# Patient Record
Sex: Female | Born: 2000
Health system: Southern US, Community
[De-identification: ages and names within clinical notes are randomized; demographics above are authoritative.]

## PROBLEM LIST (undated history)

## (undated) DIAGNOSIS — D649 Anemia, unspecified: Secondary | ICD-10-CM

## (undated) DIAGNOSIS — G47419 Narcolepsy without cataplexy: Secondary | ICD-10-CM

## (undated) HISTORY — PX: WISDOM TOOTH EXTRACTION: SHX21

## (undated) HISTORY — DX: Anemia, unspecified: D64.9

## (undated) HISTORY — DX: Narcolepsy without cataplexy: G47.419

---
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
## 2018-06-23 ENCOUNTER — Institutional Professional Consult (permissible substitution): Payer: Self-pay | Admitting: Neurology

## 2018-07-22 ENCOUNTER — Telehealth: Payer: Self-pay | Admitting: Obstetrics & Gynecology

## 2018-07-22 NOTE — Telephone Encounter (Signed)
Sandra Morrison referring for Emergent referral: Irregular menstruation - Heavy bleeding. Called and left voicemail for patient to call back to be schedule

## 2018-07-27 ENCOUNTER — Encounter: Payer: Self-pay | Admitting: Obstetrics and Gynecology

## 2018-07-27 ENCOUNTER — Ambulatory Visit (INDEPENDENT_AMBULATORY_CARE_PROVIDER_SITE_OTHER): Payer: 59 | Admitting: Obstetrics and Gynecology

## 2018-07-27 VITALS — BP 130/60 | HR 99 | Wt 256.0 lb

## 2018-07-27 DIAGNOSIS — Z6841 Body Mass Index (BMI) 40.0 and over, adult: Secondary | ICD-10-CM

## 2018-07-27 DIAGNOSIS — N939 Abnormal uterine and vaginal bleeding, unspecified: Secondary | ICD-10-CM | POA: Diagnosis not present

## 2018-07-27 MED ORDER — MEDROXYPROGESTERONE ACETATE 10 MG PO TABS: ORAL_TABLET | ORAL | 0 refills | Status: DC

## 2018-07-27 NOTE — Patient Instructions (Signed)
Polycystic Ovarian Syndrome °Polycystic ovarian syndrome (PCOS) is a common hormonal disorder among women of reproductive age. In most women with PCOS, many small fluid-filled sacs (cysts) grow on the ovaries, and the cysts are not part of a normal menstrual cycle. PCOS can cause problems with your menstrual periods and make it difficult to get pregnant. It can also cause an increased risk of miscarriage with pregnancy. If it is not treated, PCOS can lead to serious health problems, such as diabetes and heart disease. °What are the causes? °The cause of PCOS is not known, but it may be the result of a combination of certain factors, such as: °· Irregular menstrual cycle. °· High levels of certain hormones (androgens). °· Problems with the hormone that helps to control blood sugar (insulin resistance). °· Certain genes. ° °What increases the risk? °This condition is more likely to develop in women who have a family history of PCOS. °What are the signs or symptoms? °Symptoms of PCOS may include: °· Multiple ovarian cysts. °· Infrequent periods or no periods. °· Periods that are too frequent or too heavy. °· Unpredictable periods. °· Inability to get pregnant (infertility) because of not ovulating. °· Increased growth of hair on the face, chest, stomach, back, thumbs, thighs, or toes. °· Acne or oily skin. Acne may develop during adulthood, and it may not respond to treatment. °· Pelvic pain. °· Weight gain or obesity. °· Patches of thickened and dark brown or black skin on the neck, arms, breasts, or thighs (acanthosis nigricans). °· Excess hair growth on the face, chest, abdomen, or upper thighs (hirsutism). ° °How is this diagnosed? °This condition is diagnosed based on: °· Your medical history. °· A physical exam, including a pelvic exam. Your health care provider may look for areas of increased hair growth on your skin. °· Tests, such as: °? Ultrasound. This may be used to examine the ovaries and the lining of the  uterus (endometrium) for cysts. °? Blood tests. These may be used to check levels of sugar (glucose), female hormone (testosterone), and female hormones (estrogen and progesterone) in your blood. ° °How is this treated? °There is no cure for PCOS, but treatment can help to manage symptoms and prevent more health problems from developing. Treatment varies depending on: °· Your symptoms. °· Whether you want to have a baby or whether you need birth control (contraception). ° °Treatment may include nutrition and lifestyle changes along with: °· Progesterone hormone to start a menstrual period. °· Birth control pills to help you have regular menstrual periods. °· Medicines to make you ovulate, if you want to get pregnant. °· Medicine to reduce excessive hair growth. °· Surgery, in severe cases. This may involve making small holes in one or both of your ovaries. This decreases the amount of testosterone that your body produces. ° °Follow these instructions at home: °· Take over-the-counter and prescription medicines only as told by your health care provider. °· Follow a healthy meal plan. This can help you reduce the effects of PCOS. °? Eat a healthy diet that includes lean proteins, complex carbohydrates, fresh fruits and vegetables, low-fat dairy products, and healthy fats. Make sure to eat enough fiber. °· If you are overweight, lose weight as told by your health care provider. °? Losing 10% of your body weight may improve symptoms. °? Your health care provider can determine how much weight loss is best for you and can help you lose weight safely. °· Keep all follow-up visits as told by   your health care provider. This is important. °Contact a health care provider if: °· Your symptoms do not get better with medicine. °· You develop new symptoms. °This information is not intended to replace advice given to you by your health care provider. Make sure you discuss any questions you have with your health care  provider. °Document Released: 12/27/2004 Document Revised: 04/30/2016 Document Reviewed: 02/18/2016 °Elsevier Interactive Patient Education © 2018 [employer] ° °

## 2018-07-27 NOTE — Progress Notes (Signed)
Gynecology Abnormal Uterine Bleeding Initial Evaluation   Chief Complaint:  Chief Complaint  Patient presents with  . Referral    heavy bleeding    History of Present Illness:    Paitient is a 17 y.o. No obstetric history on file. who LMP was Patient's last menstrual period was 07/22/2018 (exact date)., presents today for a problem visit.  She complains of menometrorrhagia that has been present for sometime.  The patient menstrual complaints are chronic present for the past 6 months.  She has irregular cycle intervals and they are associated with mild menstrual cramping.  Will often times skip months with prolonged bleeding several week for the last two cycles. She currently uses OCP (estrogen/progesterone)for contraception, but has continued to have some bleeding.  She did stop bleeding following high dose provera 20mg  tid, but started bleeding again after moving to 20mg  daily.    Previous evaluation: None Prior Diagnosis: polycystic ovarian disease. Previous Treatment: POP (Provera) and OCP.  LMP: Patient's last menstrual period was 07/22/2018 (exact date). Menarche:13  States periods were initially monthly and regular and then started becoming more irregular.     Paramter Normal / Abnormal Prsent  Frequency Amenoorhea     Infrequent (>38 days) X   Normal (?24 days ?38 days)     Freequent (<24 days)    Duration Normal (?8 days)     Prolonged (>8 days) X  Regularity Regular (shortest to longest cycle variation ?7-9 days)*     Irregular (shortest to longest cycle variation ?8-10days)* X  Flow Volume Light    (Self reported) Normal     Heavy X      Intermenstrual Bleeding None X   Random     Cyclical early     Cyclical mid     Cyclical late        Unscheduled Bleeding  Not applicable    (exogenous hormones) Absent     Present X   FIGO AUB I System: *The available evidence suggests that, using these criteria, the normal range (shortest to longest) varies with age:  7-25 y of age, ?19 d; 45-41 y, ?7 d; and for 69-45 y, ?9 d    Review of Systems: ROS  Past Medical History:  Past Medical History:  Diagnosis Date  . Anemia   . Narcolepsy     Past Surgical History:  Past Surgical History:  Procedure Laterality Date  . WISDOM TOOTH EXTRACTION      Obstetric History: No obstetric history on file.  Family History:  Family History  Problem Relation Age of Onset  . Melanoma Mother   . Ovarian cancer Paternal Grandmother 60  . Esophageal cancer Paternal Grandfather     Social History:  Social History   Socioeconomic History  . Marital status: Single    Spouse name: Not on file  . Number of children: Not on file  . Years of education: Not on file  . Highest education level: Not on file  Occupational History  . Not on file  Social Needs  . Financial resource strain: Not on file  . Food insecurity:    Worry: Not on file    Inability: Not on file  . Transportation needs:    Medical: Not on file    Non-medical: Not on file  Tobacco Use  . Smoking status: Never Smoker  . Smokeless tobacco: Never Used  Substance and Sexual Activity  . Alcohol use: Never    Frequency: Never  . Drug  use: Never  . Sexual activity: Yes    Birth control/protection: None  Lifestyle  . Physical activity:    Days per week: Not on file    Minutes per session: Not on file  . Stress: Not on file  Relationships  . Social connections:    Talks on phone: Not on file    Gets together: Not on file    Attends religious service: Not on file    Active member of club or organization: Not on file    Attends meetings of clubs or organizations: Not on file    Relationship status: Not on file  . Intimate partner violence:    Fear of current or ex partner: Not on file    Emotionally abused: Not on file    Physically abused: Not on file    Forced sexual activity: Not on file  Other Topics Concern  . Not on file  Social History Narrative  . Not on file     Allergies:  No Known Allergies  Medications: Prior to Admission medications   Medication Sig Start Date End Date Taking? Authorizing Provider  methylphenidate (RITALIN) 10 MG tablet Take 10 mg by mouth 2 (two) times daily.   Yes [provider]    Physical Exam Blood pressure 130/60, pulse 99, weight 256 lb (116.1 kg), last menstrual period 07/22/2018. There is no height or weight on file to calculate BMI.  Patient's last menstrual period was 07/22/2018 (exact date).  General: NAD HEENT: normocephalic, anicteric Thyroid: no enlargement, no palpable nodules Pulmonary: No increased work of breathing Cardiovascular: RRR, distal pulses 2+ Extremities: no edema, erythema, or tenderness Neurologic: Grossly intact Psychiatric: mood appropriate, affect full    Assessment: 17 y.o. No obstetric history on file. with abnormal uterine bleeding  Plan: Problem List Items Addressed This Visit    None    Visit Diagnoses    Abnormal uterine bleeding    -  Primary   Relevant Orders   TSH+Prl+FSH+TestT+LH+DHEA S...   Hemoglobin A1c   US Transvaginal Non-OB   Class 3 severe obesity without serious comorbidity with body mass index (BMI) of 40.0 to 44.9 in adult, unspecified obesity type (HCC)       Relevant Medications   methylphenidate (RITALIN) 10 MG tablet   Other Relevant Orders   Hemoglobin A1c      1) Discussed management options for abnormal uterine bleeding including expectant, NSAIDs, tranexamic acid (Lysteda), oral progesterone (Provera, norethindrone, megace), Depo Provera, Levonorgestrel containing IUD, endometrial ablation (Novasure) or hysterectomy as definitive surgical management.  Discussed risks and benefits of each method.   Final management decision will hinge on results of patient's work up and whether an underlying etiology for the patients bleeding symptoms can be discerned.  We will conduct a basic work up examining using the PALM-COIEN classification  system.  In the meantime the patient opts to trial Provera while we await results of her ultrasound and labs.  The role of unopposed estrogen in the development of endometrial hyperplasia or carcinoma is discussed.  The risk of endometrial hyperplasia is linearly correlated with increasing BMI given the production of estrone by adipose tissue. Printed patient education handouts were given to the patient to review at home.  Bleeding precautions reviewed.  - high dose provera with cessation to elicit withdrawal bleed  - allow withdrawal bleed to finish prior to starting OCP  2) Return in about 1 week (around 08/03/2018) for TVUS and follow up.   [name], MD, Cherlynn June  [place], [facility] 07/27/2018, 9:39 AM

## 2018-07-31 LAB — TSH+PRL+FSH+TESTT+LH+DHEA S...
17-Hydroxyprogesterone: 11 ng/dL
ANDROSTENEDIONE: 54 ng/dL (ref 41–262)
DHEA-SO4: 247.3 ug/dL (ref 110.0–433.2)
FSH: 0.3 m[IU]/mL
LH: <0.2 m[IU]/mL
Prolactin: 20.5 ng/mL (ref 4.8–23.3)
TESTOSTERONE: 40 ng/dL
TSH: 2.48 u[IU]/mL (ref 0.450–4.500)
Testosterone, Free: 6.6 pg/mL

## 2018-07-31 LAB — HEMOGLOBIN A1C
Est. average glucose Bld gHb Est-mCnc: 103 mg/dL
Hgb A1c MFr Bld: 5.2 % (ref 4.8–5.6)

## 2018-08-04 ENCOUNTER — Encounter: Payer: Self-pay | Admitting: Obstetrics and Gynecology

## 2018-08-04 ENCOUNTER — Ambulatory Visit (INDEPENDENT_AMBULATORY_CARE_PROVIDER_SITE_OTHER): Payer: 59

## 2018-08-04 ENCOUNTER — Ambulatory Visit (INDEPENDENT_AMBULATORY_CARE_PROVIDER_SITE_OTHER): Payer: 59 | Admitting: Obstetrics and Gynecology

## 2018-08-04 VITALS — BP 130/60 | HR 122 | Ht 66.0 in | Wt 268.0 lb

## 2018-08-04 DIAGNOSIS — E282 Polycystic ovarian syndrome: Secondary | ICD-10-CM | POA: Diagnosis not present

## 2018-08-04 DIAGNOSIS — N939 Abnormal uterine and vaginal bleeding, unspecified: Secondary | ICD-10-CM

## 2018-08-04 MED ORDER — NORGESTIMATE-ETH ESTRADIOL 0.25-35 MG-MCG PO TABS: 1.0000 | ORAL_TABLET | Freq: Every day | ORAL | 11 refills | Status: DC

## 2018-08-04 NOTE — Progress Notes (Signed)
Gynecology Ultrasound Follow Up  Chief Complaint:  Chief Complaint  Patient presents with  . Follow-up    GYN U/S     History of Present Illness: Patient is a 17 y.o. female who presents today for ultrasound evaluation of abnormal uterine bleeding.  Ultrasound demonstrates the following findgins Adnexa: Polycystic appearance Uterus: Non-enlarged with endometrial stripe non-thickend at just over 61mm, no focal abnormalities Additional: no free fluid  Patient reports she never stopped bleeding on Provera.  Endometrial stripe appears thinned out on imaging today.  Review of Systems: Review of Systems  Constitutional: Negative.   Gastrointestinal: Negative.   Endo/Heme/Allergies: Negative.     Past Medical History:  Past Medical History:  Diagnosis Date  . Anemia   . Narcolepsy     Past Surgical History:  Past Surgical History:  Procedure Laterality Date  . WISDOM TOOTH EXTRACTION      Gynecologic History:  Patient's last menstrual period was 07/22/2018 (exact date).  Family History:  Family History  Problem Relation Age of Onset  . Melanoma Mother   . Ovarian cancer Paternal Grandmother 45  . Esophageal cancer Paternal Grandfather     Social History:  Social History   Socioeconomic History  . Marital status: Single    Spouse name: Not on file  . Number of children: Not on file  . Years of education: Not on file  . Highest education level: Not on file  Occupational History  . Not on file  Social Needs  . Financial resource strain: Not on file  . Food insecurity:    Worry: Not on file    Inability: Not on file  . Transportation needs:    Medical: Not on file    Non-medical: Not on file  Tobacco Use  . Smoking status: Never Smoker  . Smokeless tobacco: Never Used  Substance and Sexual Activity  . Alcohol use: Never    Frequency: Never  . Drug use: Never  . Sexual activity: Yes    Birth control/protection: None  Lifestyle  . Physical  activity:    Days per week: Not on file    Minutes per session: Not on file  . Stress: Not on file  Relationships  . Social connections:    Talks on phone: Not on file    Gets together: Not on file    Attends religious service: Not on file    Active member of club or organization: Not on file    Attends meetings of clubs or organizations: Not on file    Relationship status: Not on file  . Intimate partner violence:    Fear of current or ex partner: Not on file    Emotionally abused: Not on file    Physically abused: Not on file    Forced sexual activity: Not on file  Other Topics Concern  . Not on file  Social History Narrative  . Not on file    Allergies:  No Known Allergies  Medications: Prior to Admission medications   Medication Sig Start Date End Date Taking? Authorizing Provider  medroxyPROGESTERone (PROVERA) 10 MG tablet Take 2 tablets (20mg ) po tid x 7 days 07/27/18  Yes [name], MD  methylphenidate (RITALIN) 10 MG tablet Take 10 mg by mouth 2 (two) times daily.   Yes [provider]  norgestimate-ethinyl estradiol (ORTHO-CYCLEN,SPRINTEC,PREVIFEM) 0.25-35 MG-MCG tablet Take 1 tablet by mouth daily. 08/04/18   [name], MD    Physical Exam Vitals: Blood pressure 130/60, pulse Marland Kitchen)  122, height 5\' 6"  (1.676 m), weight 268 lb (121.6 kg), last menstrual period 07/22/2018.  General: NAD HEENT: normocephalic, anicteric Pulmonary: No increased work of breathing Extremities: no edema, erythema, or tenderness Neurologic: Grossly intact, normal gait Psychiatric: mood appropriate, affect full  US Transvaginal Non-ob  Result Date: 08/06/2018 Patient Name: Sandra Morrison DOB: Oct 16, 2000 MRN: [id number] ULTRASOUND REPORT Location: [facility] Date of Service: 08/04/2018 Indications:AUB Findings: The uterus is anteverted and measures 8.7 x 4.4 x 4.7cm. Echo texture is homogenous without evidence of focal masses. The Endometrium measures 5.1 mm. Right  Ovary measures 2.7 x 2.4 x 1.8 cm. Left Ovary measures 2.6 x 2.3 x 1.3 cm. Follicles are difficult to see on right ovary. Left ovary has multiple small peripheral follicles suggestive of PCOS. Survey of the adnexa demonstrates no adnexal masses. There is no free fluid in the cul de sac. Impression: 1. Questionable PCOS, otherwise normal gyn ultrasound. Recommendations: 1.Clinical correlation with the patient's History and Physical Exam. Vita Barley, RDMS RVT Images reviewed.  Normal GYN study other than polycystic appearing ovaries.  [name], MD, [facility], [facility]     Assessment: 17 y.o. G0P0000 No problem-specific Assessment & Plan notes found for this encounter.   Plan: Problem List Items Addressed This Visit      Endocrine   PCOS (polycystic ovarian syndrome) - Primary    Other Visit Diagnoses    Abnormal uterine bleeding          1) PCOS based on labs and ultrasound findings today  2) Start sprintec in 1 week. Follow up 2 months to assess response  3) Discussed addition of metformin if still having some bleeding issues  4) Return in about 2 months (around 10/04/2018) for medication follow up.    [name], MD, [facility], [facility] 08/04/2018, 11:57 AM

## 2018-08-05 ENCOUNTER — Institutional Professional Consult (permissible substitution): Payer: Self-pay | Admitting: Neurology

## 2018-09-28 ENCOUNTER — Ambulatory Visit: Payer: 59 | Admitting: Neurology

## 2018-09-28 ENCOUNTER — Encounter: Payer: Self-pay | Admitting: Neurology

## 2018-09-28 VITALS — BP 149/82 | HR 83 | Ht 66.0 in | Wt 274.0 lb

## 2018-09-28 DIAGNOSIS — E282 Polycystic ovarian syndrome: Secondary | ICD-10-CM | POA: Diagnosis not present

## 2018-09-28 DIAGNOSIS — N946 Dysmenorrhea, unspecified: Secondary | ICD-10-CM

## 2018-09-28 DIAGNOSIS — R6889 Other general symptoms and signs: Secondary | ICD-10-CM | POA: Diagnosis not present

## 2018-09-28 DIAGNOSIS — G4719 Other hypersomnia: Secondary | ICD-10-CM

## 2018-09-28 DIAGNOSIS — Z793 Long term (current) use of hormonal contraceptives: Secondary | ICD-10-CM
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
## 2018-10-05 ENCOUNTER — Encounter: Payer: Self-pay | Admitting: Neurology

## 2018-10-05 ENCOUNTER — Telehealth: Payer: Self-pay | Admitting: Neurology

## 2018-10-05 LAB — NARCOLEPSY EVALUATION
HLA-DQ ALPHA: NEGATIVE
HLA-DQ BETA: NEGATIVE

## 2018-10-05 NOTE — Telephone Encounter (Signed)
Pt would like a call back with her lab results. Please advise.

## 2018-10-05 NOTE — Telephone Encounter (Signed)
Lab results are still pending at this time. I will call the patient and advise of the lab results once they have posted.

## 2018-10-06 ENCOUNTER — Encounter: Payer: Self-pay | Admitting: Obstetrics and Gynecology

## 2018-10-06 ENCOUNTER — Telehealth: Payer: Self-pay | Admitting: Neurology

## 2018-10-06 ENCOUNTER — Ambulatory Visit (INDEPENDENT_AMBULATORY_CARE_PROVIDER_SITE_OTHER): Payer: 59 | Admitting: Obstetrics and Gynecology

## 2018-10-06 VITALS — BP 116/62 | HR 107 | Ht 66.0 in | Wt 275.0 lb

## 2018-10-06 DIAGNOSIS — E282 Polycystic ovarian syndrome: Secondary | ICD-10-CM | POA: Diagnosis not present

## 2018-10-06 DIAGNOSIS — Z3041 Encounter for surveillance of contraceptive pills: Secondary | ICD-10-CM | POA: Diagnosis not present

## 2018-10-06 DIAGNOSIS — N939 Abnormal uterine and vaginal bleeding, unspecified: Secondary | ICD-10-CM | POA: Diagnosis not present

## 2018-10-06 NOTE — Progress Notes (Signed)
Obstetrics & Gynecology Office Visit   Chief Complaint:  Chief Complaint  Patient presents with  . Follow-up    Medication    History of Present Illness: 18 y.o. G0P0000 presenting for medication follow up for a diagnosis of abnormal uterine bleeding (AUB-O secondary to PCOS).  She is currently being managed with combination OCP.   The patient reports good control of symptoms on her current regimen.  On her current medication regimen she initially had some continued breakthrough bleeding on pill pack 1 which has subsided on starting the second pill pack.   She has not noted any side-effects or new symptoms.    Review of Systems: Review of Systems  Constitutional: Negative.   Gastrointestinal: Negative.   Genitourinary: Negative.      Past Medical History:  Past Medical History:  Diagnosis Date  . Anemia   . Narcolepsy     Past Surgical History:  Past Surgical History:  Procedure Laterality Date  . WISDOM TOOTH EXTRACTION      Gynecologic History: Patient's last menstrual period was 09/30/2018 (exact date).  Obstetric History: G0P0000  Family History:  Family History  Problem Relation Age of Onset  . Melanoma Mother   . Ovarian cancer Paternal Grandmother 61  . Esophageal cancer Paternal Grandfather     Social History:  Social History   Socioeconomic History  . Marital status: Single    Spouse name: Not on file  . Number of children: Not on file  . Years of education: Not on file  . Highest education level: Not on file  Occupational History  . Not on file  Social Needs  . Financial resource strain: Not on file  . Food insecurity:    Worry: Not on file    Inability: Not on file  . Transportation needs:    Medical: Not on file    Non-medical: Not on file  Tobacco Use  . Smoking status: Never Smoker  . Smokeless tobacco: Never Used  Substance and Sexual Activity  . Alcohol use: Never    Frequency: Never  . Drug use: Never  . Sexual activity:  Yes    Birth control/protection: None  Lifestyle  . Physical activity:    Days per week: Not on file    Minutes per session: Not on file  . Stress: Not on file  Relationships  . Social connections:    Talks on phone: Not on file    Gets together: Not on file    Attends religious service: Not on file    Active member of club or organization: Not on file    Attends meetings of clubs or organizations: Not on file    Relationship status: Not on file  . Intimate partner violence:    Fear of current or ex partner: Not on file    Emotionally abused: Not on file    Physically abused: Not on file    Forced sexual activity: Not on file  Other Topics Concern  . Not on file  Social History Narrative  . Not on file    Allergies:  No Known Allergies  Medications: Prior to Admission medications   Medication Sig Start Date End Date Taking? Authorizing Provider  norgestimate-ethinyl estradiol (ORTHO-CYCLEN,SPRINTEC,PREVIFEM) 0.25-35 MG-MCG tablet Take 1 tablet by mouth daily. 08/04/18  Yes [name], MD  methylphenidate (RITALIN) 10 MG tablet Take 10 mg by mouth daily as needed (last taken 09/15/2018).     [provider]    Physical  Exam Vitals:  Vitals:   10/06/18 1009  BP: 116/62  Pulse: (!) 107   Patient's last menstrual period was 09/30/2018 (exact date).  General: NAD HEENT: normocephalic, anicteric Pulmonary: No increased work of breathing Neurologic: Grossly intact Psychiatric: mood appropriate, affect full  Female chaperone present for pelvic  portions of the physical exam  Assessment: 18 y.o. G0P0000 presenting for follow up of AUB-O  Plan: Problem List Items Addressed This Visit      Endocrine   PCOS (polycystic ovarian syndrome)    Other Visit Diagnoses    Encounter for surveillance of contraceptive pills    -  Primary   Abnormal uterine bleeding         1) AUB - good response.  Continue OCP for cycle control.  Discussed should she have  issues with renewed AUB or dysmenorrhea we discussed possible additional next steps.  2) A total of 15 minutes were spent in face-to-face contact with the patient during this encounter with over half of that time devoted to counseling and coordination of care.  3) Return in about 1 year (around 10/07/2019) for annual.    [name], MD, [place], [facility] 10/06/2018, 10:37 AM

## 2018-10-06 NOTE — Telephone Encounter (Signed)
-----  Message from [name], MD sent at 10/05/2018  5:22 PM EST ----- Negative HLA test.  Is she willing to undergo MSLT ?  This test has a specificity of  85% for narcolepsy, consider.  Please send a CC to  Charlott Holler, NP

## 2018-10-06 NOTE — Telephone Encounter (Signed)
Called the patient and made her aware that the gene for narcolepsy testing came back negative. Advised this patient this means she doesn't carry the gene for narcolepsy. Informed the patient to continue to withhold her ritalin medication as she is scheduled for the MSLT and the patient would like to move forward with that. Pt verbalized understanding. Pt had no questions at this time but was encouraged to call back if questions arise.

## 2018-10-26 ENCOUNTER — Ambulatory Visit (INDEPENDENT_AMBULATORY_CARE_PROVIDER_SITE_OTHER): Payer: 59 | Admitting: Neurology

## 2018-10-26 DIAGNOSIS — N946 Dysmenorrhea, unspecified: Secondary | ICD-10-CM

## 2018-10-26 DIAGNOSIS — Z793 Long term (current) use of hormonal contraceptives: Secondary | ICD-10-CM

## 2018-10-26 DIAGNOSIS — G4710 Hypersomnia, unspecified: Secondary | ICD-10-CM

## 2018-10-26 DIAGNOSIS — R6889 Other general symptoms and signs: Secondary | ICD-10-CM

## 2018-10-26 DIAGNOSIS — E282 Polycystic ovarian syndrome: Secondary | ICD-10-CM

## 2018-10-26 DIAGNOSIS — G4719 Other hypersomnia: Secondary | ICD-10-CM

## 2018-10-27 ENCOUNTER — Other Ambulatory Visit: Payer: Self-pay | Admitting: Neurology

## 2018-10-27 ENCOUNTER — Ambulatory Visit (INDEPENDENT_AMBULATORY_CARE_PROVIDER_SITE_OTHER): Payer: 59 | Admitting: Neurology

## 2018-10-27 DIAGNOSIS — Z793 Long term (current) use of hormonal contraceptives: Secondary | ICD-10-CM

## 2018-10-27 DIAGNOSIS — E282 Polycystic ovarian syndrome: Secondary | ICD-10-CM

## 2018-10-27 DIAGNOSIS — G4719 Other hypersomnia: Secondary | ICD-10-CM

## 2018-10-27 DIAGNOSIS — R6889 Other general symptoms and signs: Secondary | ICD-10-CM | POA: Diagnosis not present

## 2018-10-27 DIAGNOSIS — G4752 REM sleep behavior disorder: Secondary | ICD-10-CM

## 2018-10-27 DIAGNOSIS — N946 Dysmenorrhea, unspecified: Secondary | ICD-10-CM

## 2018-10-27 NOTE — Addendum Note (Signed)
Addended by: Inis Sizer D on: 10/27/2018 04:12 PM   Modules accepted: Orders

## 2018-10-30 LAB — COMPREHENSIVE DRUG ANALYSIS,UR

## 2018-11-01 ENCOUNTER — Encounter: Payer: Self-pay | Admitting: Neurology

## 2018-11-01 DIAGNOSIS — R6889 Other general symptoms and signs: Secondary | ICD-10-CM | POA: Insufficient documentation

## 2018-11-01 DIAGNOSIS — G4719 Other hypersomnia: Secondary | ICD-10-CM | POA: Insufficient documentation
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
## 2018-11-01 NOTE — Procedures (Signed)
[facility]  Name:  Sandra Morrison, Sandra Morrison. Reference [id number]  Study Date: 10/27/2018 Procedure #: [id number]  DOB: [birth year]    Protocol  This is a 13 channel Multiple Sleep Latency Test comprised of 5 channels of EEG (T3-Cz, Cz-T4, F4-M1, C4-M1, O2-M1), 3 channels of Chin EMG, 4 channels of EOG and 1 channel for ECG.   All channels were sampled at 256hz .    This polysomnographic procedure is designed to evaluate (1) the complaint of excessive daytime sleepiness by quantifying the time required to fall asleep and (2) the possibility of narcolepsy by checking for abnormally short latencies to REM sleep.  Electrographic variables include EEG, EMG, EOG and ECG.  Patients are monitored throughout four or five 20-minute opportunities to sleep (naps) at two-hour intervals.  For each nap, the patient is allowed 20 minutes to fall asleep.  Once asleep, the patient is awakened after 15 minutes.  Between naps, the patient is kept as alert as possible.  A sleep latency of 20 minutes indicates that no sleep occurred.  Parametric Analysis  Total Number of Naps 5     NAP # Time of Nap  Sleep Latency (mins) REM Latency (mins) Sleep Time Percent Awake Time Percent  1 07:19 5 0 77 23   2 09:15 6 0 71  29   3 11:15 6 0 73  27   4 13:12 5 0 80  20   5 15:14 2 0 91  9    MSLT Summary of Naps  Sleepiness Index: 76  Mean Sleep Latency to all Five Naps: 4.8  Mean Sleep Latency to First Four Naps: 5.5  Mean Sleep Latency to First Three Naps: 5.7  Mean Sleep Latency to First Two Naps: 5.5  Number of Naps with REM Sleep: 0    Results from Preceding PSG Study  Sleep Onset Time 10:15 Sleep Efficiency (%) 96.5%  Rise Time 05:37 Sleep Latency (min) 6 min  Total Sleep Time  432.5 min REM Latency (min) 61.5 min    I attest to having reviewed every epoch of the entire raw data recording prior to the issuance of this report in accordance with the Standards of the American  Academy of Sleep Medicine.       IMPRESSION:  1. This multiple sleep latency test reveals a mean sleep latency of 4.8 minutes with 0 sleep periods (out of 5 sleep periods) during which REM sleep was recorded.  A total of 5 sleep periods were recorded.   2. This study was preceded by an overnight polysomnogram with a total sleep time (TST) of 432.5 minutes.       Name:  [name], Staff. Reference #:  [id number]  Study Date: 10/27/2018 DOB: August 17, 2001    RECOMMENDATIONS: This MSLT study is consistent with severe hypersomnolence. The study may indicate idiopathic hypersomnolence with long sleep duration. This study is not consistent with a diagnosis of narcolepsy due to the lack of REM sleep onset in any of the recorded naps and lack of early onset REM sleep in the preceding PSG.    [name], M.D.  10-31-2018 Diplomat, American Board of Psychiatry and Neurology  Diplomat, [place] of Sleep Medicine Market researcher, [place] [facility]

## 2018-11-05 ENCOUNTER — Ambulatory Visit: Payer: 59 | Admitting: Neurology

## 2018-11-05 ENCOUNTER — Encounter: Payer: Self-pay | Admitting: Neurology

## 2018-11-05 VITALS — BP 110/72 | HR 77 | Ht 67.0 in | Wt 278.0 lb

## 2018-11-05 DIAGNOSIS — G4711 Idiopathic hypersomnia with long sleep time: Secondary | ICD-10-CM | POA: Diagnosis not present

## 2018-11-05 DIAGNOSIS — G4726 Circadian rhythm sleep disorder, shift work type: Secondary | ICD-10-CM

## 2018-11-05 MED ORDER — ARMODAFINIL 200 MG PO TABS: 200.0000 mg | ORAL_TABLET | Freq: Every day | ORAL | 5 refills | Status: DC

## 2018-11-05 NOTE — Patient Instructions (Signed)
Armodafinil tablets What is this medicine? ARMODAFINIL (ar moe DAF i nil) is used to treat excessive sleepiness caused by certain sleep disorders. This includes narcolepsy, sleep apnea, and shift work sleep disorder. This medicine may be used for other purposes; ask your health care provider or pharmacist if you have questions. COMMON BRAND NAME(S): Nuvigil What should I tell my health care provider before I take this medicine? They need to know if you have any of these conditions: -bipolar disorder -depression -drug or alcohol abuse or addiction -heart disease -high blood pressure -kidney disease -liver disease -schizophrenia -suicidal thoughts, plans, or attempt; a previous suicide attempt by you or a family member -an unusual or allergic reaction to armodafinil, modafinil, medicines, foods, dyes, or preservatives -pregnant or trying to get pregnant -breast-feeding How should I use this medicine? Take this medicine by mouth with a glass of water. Follow the directions on the prescription label. Take your doses at regular intervals. Do not take your medicine more often than directed. Do not stop taking this medicine suddenly except upon the advice of your doctor. Stopping this medicine too quickly may cause serious side effects or your condition may worsen. A special MedGuide will be given to you by the pharmacist with each prescription and refill. Be sure to read this information carefully each time. Talk to your pediatrician regarding the use of this medicine in children. While this drug may be prescribed for children as young as 17 years of age for selected conditions, precautions do apply. Overdosage: If you think you have taken too much of this medicine contact a poison control center or emergency room at once. NOTE: This medicine is only for you. Do not share this medicine with others. What if I miss a dose? If you miss a dose, take it as soon as you can. If it is almost time for  your next dose, take only that dose. Do not take double or extra doses. What may interact with this medicine? Do not take this medicine with any of the following medications: -amphetamine or dextroamphetamine -dexmethylphenidate or methylphenidate -MAOIs like Carbex, Eldepryl, Marplan, Nardil, and Parnate -pemoline -procarbazine This medicine may also interact with the following medications: -antifungal medicines like itraconazole or ketoconazole -barbiturates, like phenobarbital -birth control pills or other hormone-containing birth control devices or implants -carbamazepine -cyclosporine -diazepam -medicines for depression, anxiety, or psychotic disturbances -phenytoin -propranolol -triazolam -warfarin This list may not describe all possible interactions. Give your health care provider a list of all the medicines, herbs, non-prescription drugs, or dietary supplements you use. Also tell them if you smoke, drink alcohol, or use illegal drugs. Some items may interact with your medicine. What should I watch for while using this medicine? Visit your doctor or health care professional for regular checks on your progress. The full effect of this medicine may not be seen right away. This medicine may affect your concentration, function, or may hide signs that you are tired. You may get dizzy. This medicine will not eliminate your abnormal tendency to fall asleep and is not a replacement for sleep. Do not change your previous behavior regarding potentially dangerous activities, such as driving, using machinery, or doing anything that needs mental alertness until you know how this drug affects you. Alcohol can make you more dizzy and may interfere with your response to this medicine or your alertness. Avoid alcoholic drinks. Birth control pills may not work properly while you are taking this medicine. While using birth control pills, you will need an  additional barrier method or an alternative  non-hormonal method of birth control during treatment with armodafinil and for 1 month after stopping armodafinil. Talk to your doctor about which extra method of birth control is right for you. It is unknown if the effects of this medicine will be increased by the use of caffeine. Caffeine is found in many foods, beverages, and medications. Ask your doctor if you should limit or change your intake of caffeine-containing products while on this medicine. Do not stop previously prescribed treatments for your condition, such as a CPAP machine, except on the advise of your physician or health care professional. What side effects may I notice from receiving this medicine? Side effects that you should report to your doctor or health care professional as soon as possible: -allergic reactions like skin rash, itching or hives, swelling of the face, lips, or tongue -anxiety -breathing or swallowing problems -chest pain -depressed mood -elevated mood, decreased need for sleep, racing thoughts, impulsive behavior -fast, irregular heartbeat -fever with rash, swollen lymph nodes, or swelling of the face -hallucination, loss of contact with reality -increased blood pressure -mouth sores, blisters, or peeling skin -sore throat, fever, or chills -suicidal thoughts or other mood changes -tremors -vomiting Side effects that usually do not require medical attention (report to your doctor or health care professional if they continue or are bothersome): -headache -nausea, diarrhea, or stomach upset -nervousness -trouble sleeping This list may not describe all possible side effects. Call your doctor for medical advice about side effects. You may report side effects to FDA at 1-800-FDA-1088. Where should I keep my medicine? Keep out of the reach of children. Store at room temperature between 20 and 25 degrees C (68 and 77 degrees F). Throw away any unused medicine after the expiration date. NOTE: This sheet is  a summary. It may not cover all possible information. If you have questions about this medicine, talk to your doctor, pharmacist, or health care provider.  2019 Elsevier/Gold Standard (2016-02-09 18:16:30)
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
## 2018-11-17 ENCOUNTER — Encounter: Payer: Self-pay | Admitting: Neurology

## 2018-11-18 NOTE — Telephone Encounter (Signed)
The alternative medications are SUNOSI, which is not metabolized through the liver and should therefor be less likely to interfere with birth control.  Cave" FDA approved only for proven Narcolepsy, and for OSA and shift working disorder.   [name], MD

## 2018-11-19 ENCOUNTER — Other Ambulatory Visit: Payer: Self-pay | Admitting: Neurology

## 2018-11-19 ENCOUNTER — Telehealth: Payer: Self-pay | Admitting: Neurology

## 2018-11-19 MED ORDER — AMPHETAMINE-DEXTROAMPHETAMINE 10 MG PO TABS: ORAL_TABLET | ORAL | 0 refills | Status: DC

## 2018-11-19 NOTE — Addendum Note (Signed)
Addended by: Darleen Crocker on: 11/19/2018 10:36 AM   Modules accepted: Orders

## 2018-11-19 NOTE — Telephone Encounter (Signed)
Dr Dohmeier agreed to order the patient Adderall immediate release and see how the patient tolerates that before we can order the adderall 10 mg XR medication.

## 2018-12-21 ENCOUNTER — Encounter: Payer: Self-pay | Admitting: Neurology

## 2018-12-21 ENCOUNTER — Other Ambulatory Visit: Payer: Self-pay | Admitting: Neurology

## 2018-12-22 ENCOUNTER — Other Ambulatory Visit: Payer: Self-pay | Admitting: Neurology

## 2018-12-22 DIAGNOSIS — R1011 Right upper quadrant pain: Secondary | ICD-10-CM | POA: Diagnosis not present

## 2018-12-22 MED ORDER — AMPHETAMINE-DEXTROAMPHETAMINE 10 MG PO TABS: ORAL_TABLET | ORAL | 0 refills | Status: DC

## 2018-12-24 ENCOUNTER — Other Ambulatory Visit: Payer: Self-pay | Admitting: Neurology

## 2018-12-24 MED ORDER — AMPHETAMINE-DEXTROAMPHETAMINE 10 MG PO TABS: ORAL_TABLET | ORAL | 0 refills | Status: DC

## 2018-12-29 ENCOUNTER — Other Ambulatory Visit: Payer: Self-pay | Admitting: Nurse Practitioner

## 2018-12-29 DIAGNOSIS — R1011 Right upper quadrant pain: Secondary | ICD-10-CM

## 2019-01-19 ENCOUNTER — Ambulatory Visit
Admission: RE | Admit: 2019-01-19 | Discharge: 2019-01-19 | Disposition: A | Discharge status: Home / Self Care | Payer: 59 | Source: Ambulatory Visit | Attending: Nurse Practitioner | Admitting: Nurse Practitioner

## 2019-01-19 ENCOUNTER — Other Ambulatory Visit: Payer: Self-pay

## 2019-01-19 DIAGNOSIS — K828 Other specified diseases of gallbladder: Secondary | ICD-10-CM | POA: Diagnosis not present

## 2019-01-19 DIAGNOSIS — R1011 Right upper quadrant pain: Secondary | ICD-10-CM | POA: Diagnosis not present

## 2019-01-28 DIAGNOSIS — R1011 Right upper quadrant pain: Secondary | ICD-10-CM | POA: Diagnosis not present

## 2019-01-28 DIAGNOSIS — M549 Dorsalgia, unspecified: Secondary | ICD-10-CM | POA: Diagnosis not present

## 2019-01-28 DIAGNOSIS — K8050 Calculus of bile duct without cholangitis or cholecystitis without obstruction: Secondary | ICD-10-CM | POA: Diagnosis not present

## 2019-02-01 ENCOUNTER — Encounter: Payer: Self-pay | Admitting: Neurology

## 2019-02-01 ENCOUNTER — Other Ambulatory Visit: Payer: Self-pay | Admitting: Neurology

## 2019-02-01 MED ORDER — AMPHETAMINE-DEXTROAMPHETAMINE 10 MG PO TABS: ORAL_TABLET | ORAL | 0 refills | Status: DC

## 2019-03-01 ENCOUNTER — Encounter: Payer: Self-pay | Admitting: Neurology

## 2019-03-01 ENCOUNTER — Other Ambulatory Visit: Payer: Self-pay | Admitting: Neurology

## 2019-03-01 MED ORDER — AMPHETAMINE-DEXTROAMPHETAMINE 10 MG PO TABS: ORAL_TABLET | ORAL | 0 refills | Status: DC

## 2019-04-07 ENCOUNTER — Encounter: Payer: Self-pay | Admitting: Neurology

## 2019-04-07 ENCOUNTER — Other Ambulatory Visit: Payer: Self-pay | Admitting: Neurology

## 2019-04-07 MED ORDER — AMPHETAMINE-DEXTROAMPHETAMINE 10 MG PO TABS: ORAL_TABLET | ORAL | 0 refills | Status: DC

## 2019-04-08 DIAGNOSIS — L732 Hidradenitis suppurativa: Secondary | ICD-10-CM | POA: Diagnosis not present

## 2019-04-08 DIAGNOSIS — D225 Melanocytic nevi of trunk: Secondary | ICD-10-CM | POA: Diagnosis not present

## 2019-05-08 ENCOUNTER — Encounter: Payer: Self-pay | Admitting: Neurology

## 2019-05-10 ENCOUNTER — Other Ambulatory Visit: Payer: Self-pay | Admitting: Neurology

## 2019-05-10 MED ORDER — AMPHETAMINE-DEXTROAMPHETAMINE 10 MG PO TABS: ORAL_TABLET | ORAL | 0 refills | Status: DC

## 2019-05-13 ENCOUNTER — Ambulatory Visit: Payer: 59 | Admitting: Neurology

## 2019-06-07 ENCOUNTER — Other Ambulatory Visit: Payer: Self-pay

## 2019-06-07 ENCOUNTER — Telehealth: Payer: Self-pay

## 2019-06-07 MED ORDER — NORGESTIMATE-ETH ESTRADIOL 0.25-35 MG-MCG PO TABS: 1.0000 | ORAL_TABLET | Freq: Every day | ORAL | 5 refills | Status: DC

## 2019-06-07 NOTE — Telephone Encounter (Signed)
Error

## 2019-06-10 ENCOUNTER — Other Ambulatory Visit: Payer: Self-pay | Admitting: Neurology

## 2019-06-10 ENCOUNTER — Encounter: Payer: Self-pay | Admitting: Neurology

## 2019-06-10 MED ORDER — AMPHETAMINE-DEXTROAMPHETAMINE 10 MG PO TABS: ORAL_TABLET | ORAL | 0 refills | Status: AC

## 2019-06-13 ENCOUNTER — Encounter: Payer: Self-pay | Admitting: Neurology

## 2019-07-17 ENCOUNTER — Ambulatory Visit: Admit: 2019-07-17 | Payer: 59 | Admitting: Surgery

## 2019-07-17 SURGERY — LAPAROSCOPIC CHOLECYSTECTOMY
Anesthesia: General

## 2019-07-19 ENCOUNTER — Telehealth: Payer: Self-pay | Admitting: Neurology

## 2019-07-19 ENCOUNTER — Encounter: Payer: Self-pay | Admitting: Neurology

## 2019-07-19 NOTE — Telephone Encounter (Signed)
LVM x2 c/a letter sent

## 2019-08-11 ENCOUNTER — Ambulatory Visit: Payer: 59 | Admitting: Neurology

## 2019-09-21 ENCOUNTER — Other Ambulatory Visit: Payer: Self-pay | Admitting: Obstetrics and Gynecology

## 2019-09-21 MED ORDER — NORGESTIMATE-ETH ESTRADIOL 0.25-35 MG-MCG PO TABS: 1.0000 | ORAL_TABLET | Freq: Every day | ORAL | 3 refills | Status: AC

## 2020-08-09 IMAGING — US ULTRASOUND ABDOMEN COMPLETE
1 series · 14 of 25 positions shown · non-contrast
Comparison: None.

CLINICAL DATA: Call acute right upper quadrant pain

EXAM:
ABDOMEN ULTRASOUND COMPLETE

[Series 1: ultrasound abdomen complete · 0.22mm/px · 14 of 111 slices shown]
[im 1/111]
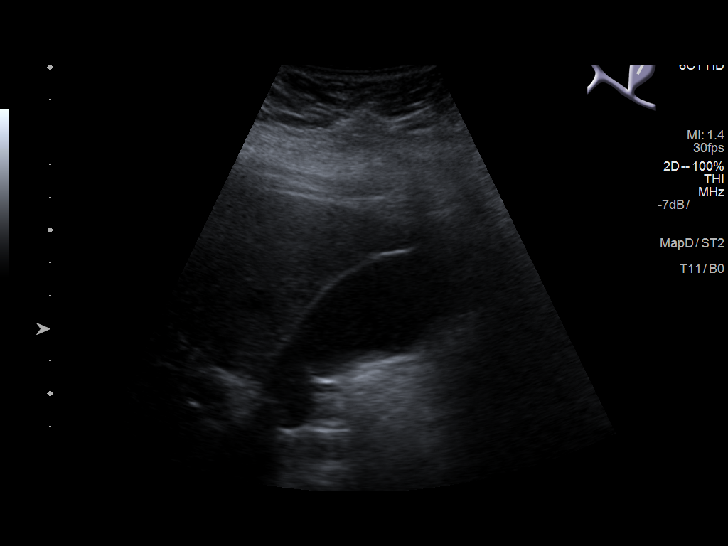
[im 10/111]
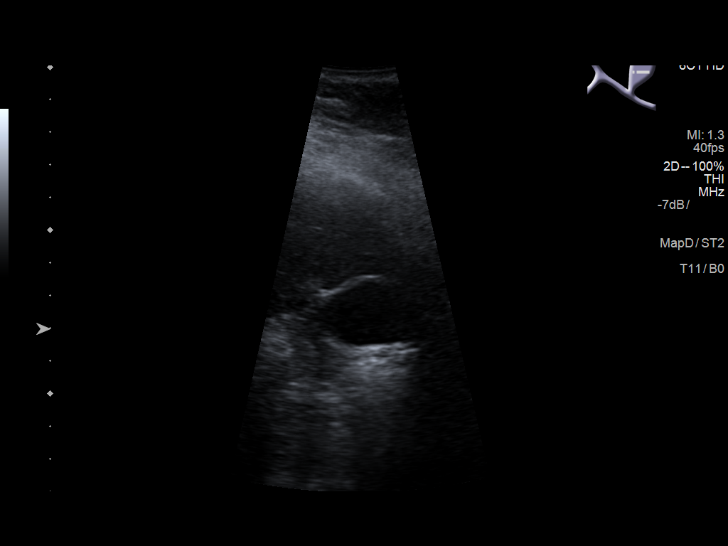
[im 19/111]
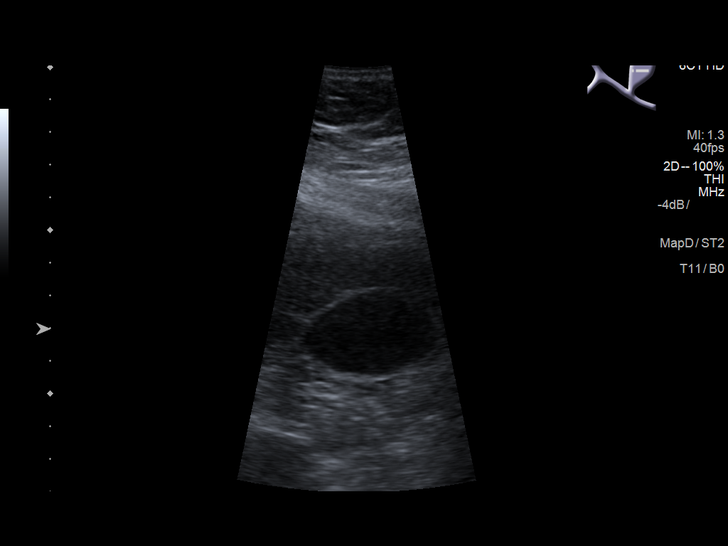
[im 28/111]
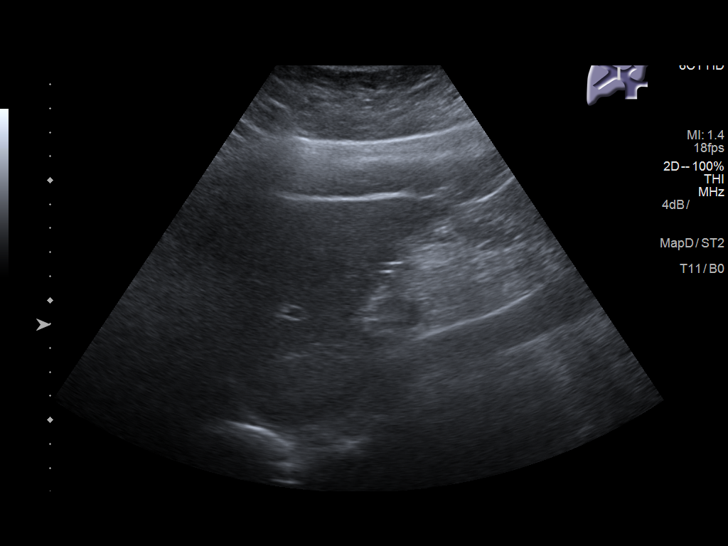
[im 37/111]
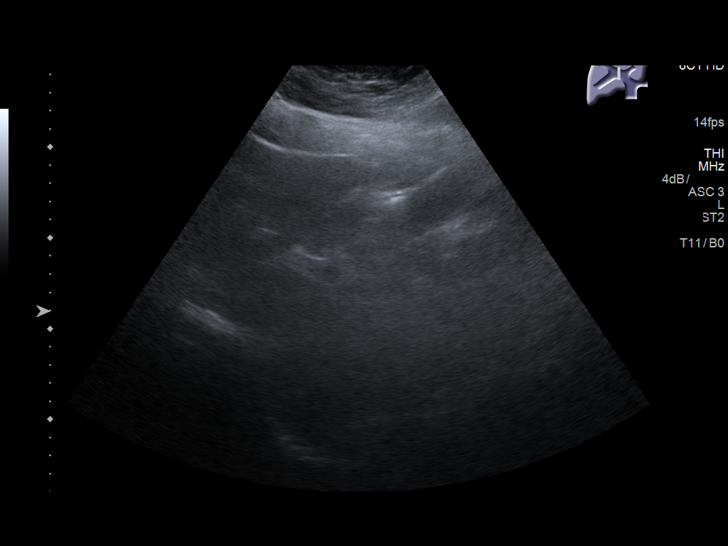
[im 42/111]
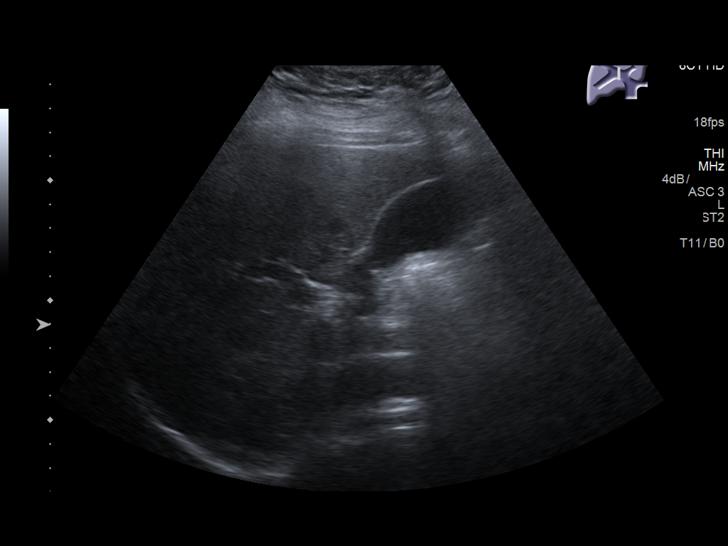
[im 51/111]
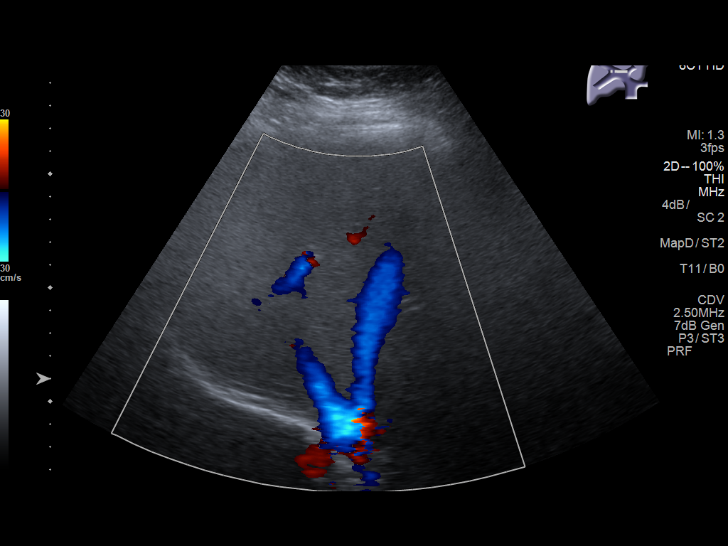
[im 60/111]
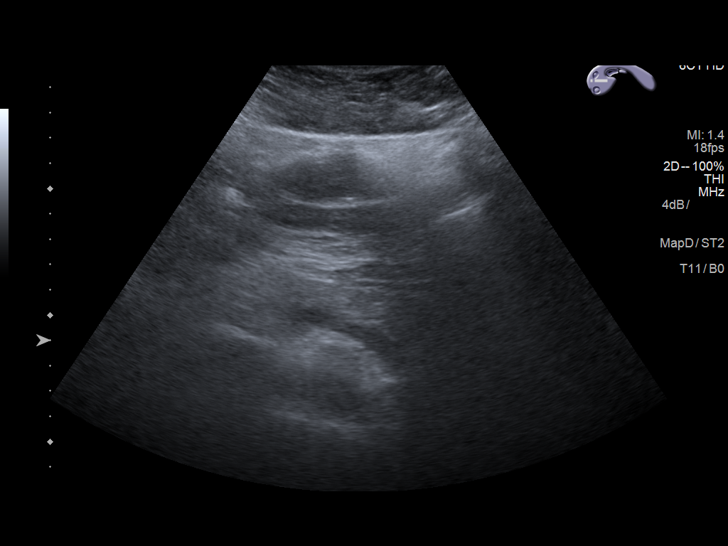
[im 69/111]
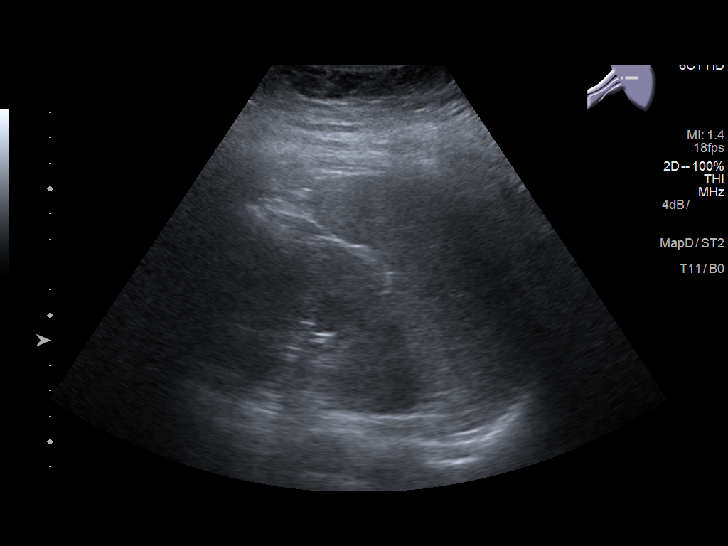
[im 74/111]
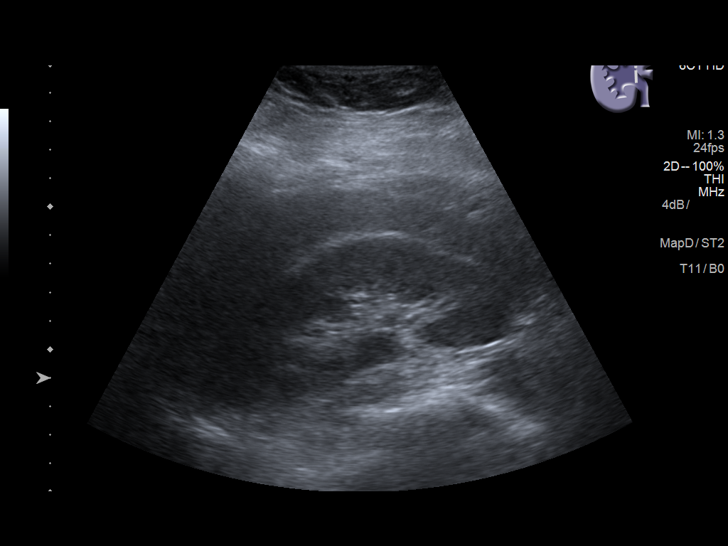
[im 83/111]
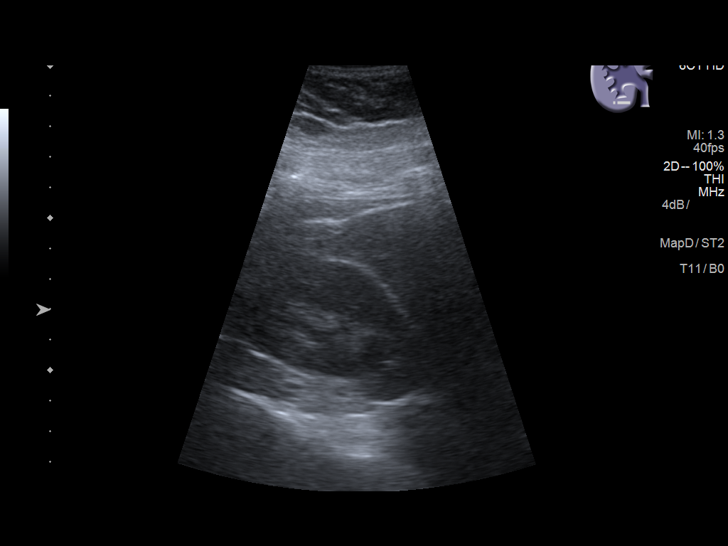
[im 92/111]
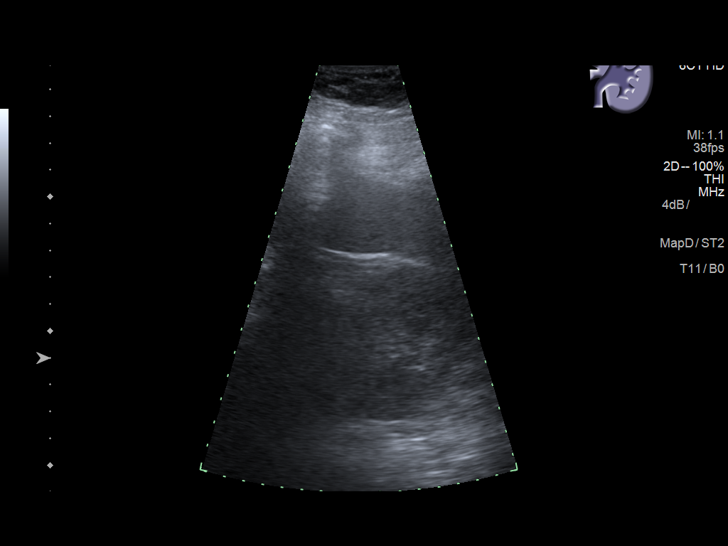
[im 101/111]
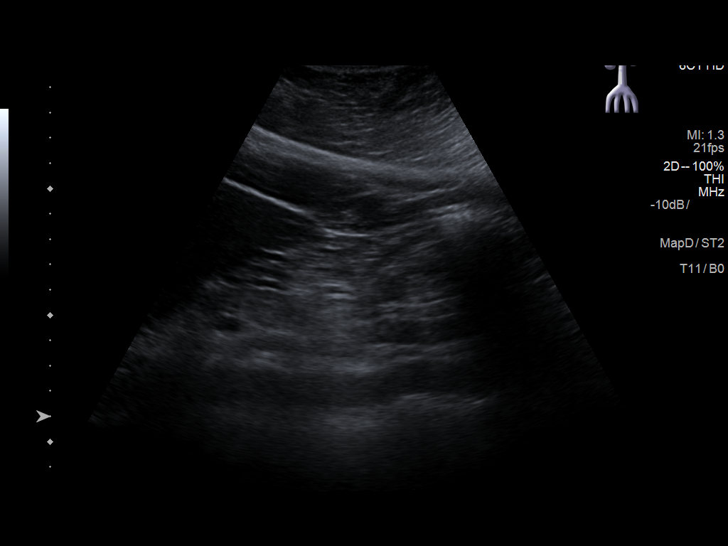
[im 111/111]
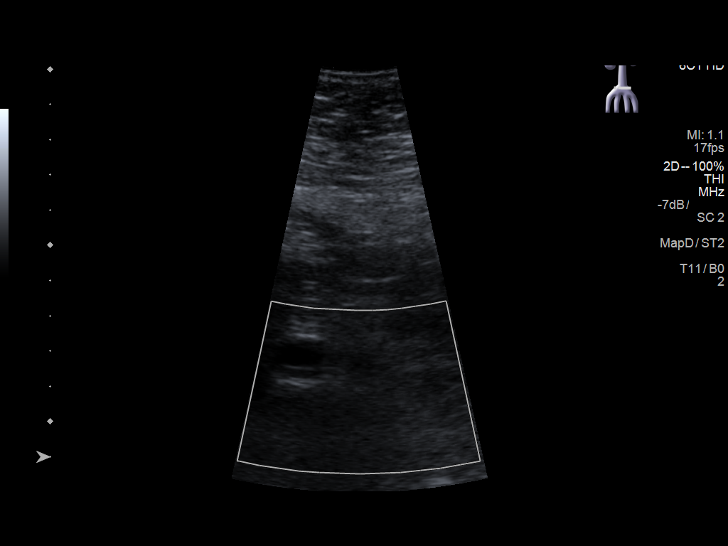

[14 of 25 positions shown; findings below may reference images not displayed]

FINDINGS: Gallbladder: Small amount of sludge. No shadowing stone. Negative
sonographic Murphy. Normal wall thickness.

Common bile duct: Diameter: 4 mm

Liver: No focal lesion identified. Within normal limits in
parenchymal echogenicity. Portal vein is patent on color Doppler
imaging with normal direction of blood flow towards the liver.

IVC: No abnormality visualized.

Pancreas: Visualized portion unremarkable.

Spleen: Size and appearance within normal limits.

Right Kidney: Length: 11.8 cm. Echogenicity within normal limits. No
mass or hydronephrosis visualized.

Left Kidney: Length: 13.2 cm. Echogenicity within normal limits. No
mass or hydronephrosis visualized.

Abdominal aorta: No aneurysm visualized.

Other findings: None.
IMPRESSION: Small amount of gallbladder sludge. Otherwise negative abdominal
ultrasound

## 2024-05-27 DIAGNOSIS — Z1331 Encounter for screening for depression: Secondary | ICD-10-CM | POA: Diagnosis not present

## 2024-05-27 DIAGNOSIS — F419 Anxiety disorder, unspecified: Secondary | ICD-10-CM | POA: Diagnosis not present

## 2024-05-27 DIAGNOSIS — G47419 Narcolepsy without cataplexy: Secondary | ICD-10-CM | POA: Diagnosis not present

## 2024-07-11 DIAGNOSIS — M7672 Peroneal tendinitis, left leg: Secondary | ICD-10-CM | POA: Diagnosis not present
